# Patient Record
Sex: Female | Born: 1935 | Race: Black or African American | Hispanic: No
Health system: Southern US, Community
[De-identification: ages and names within clinical notes are randomized; demographics above are authoritative.]

## PROBLEM LIST (undated history)

## (undated) DIAGNOSIS — I2510 Atherosclerotic heart disease of native coronary artery without angina pectoris: Secondary | ICD-10-CM

## (undated) DIAGNOSIS — I10 Essential (primary) hypertension: Secondary | ICD-10-CM

## (undated) DIAGNOSIS — D649 Anemia, unspecified: Secondary | ICD-10-CM

## (undated) DIAGNOSIS — N289 Disorder of kidney and ureter, unspecified: Secondary | ICD-10-CM

## (undated) DIAGNOSIS — F0390 Unspecified dementia without behavioral disturbance: Secondary | ICD-10-CM

---

## 2005-01-25 ENCOUNTER — Ambulatory Visit: Payer: Self-pay | Admitting: Family Medicine

## 2005-11-07 ENCOUNTER — Ambulatory Visit: Payer: Self-pay | Admitting: Nurse Practitioner

## 2006-07-16 ENCOUNTER — Emergency Department: Payer: Self-pay | Admitting: Unknown Physician Specialty

## 2006-07-16 ENCOUNTER — Other Ambulatory Visit: Payer: Self-pay

## 2006-07-20 ENCOUNTER — Ambulatory Visit: Payer: Self-pay | Admitting: Unknown Physician Specialty

## 2006-09-10 ENCOUNTER — Ambulatory Visit: Payer: Self-pay | Admitting: Internal Medicine

## 2007-04-29 ENCOUNTER — Ambulatory Visit: Payer: Self-pay | Admitting: Vascular Surgery

## 2007-10-08 ENCOUNTER — Ambulatory Visit: Payer: Self-pay | Admitting: Family Medicine

## 2007-11-13 ENCOUNTER — Ambulatory Visit: Payer: Self-pay | Admitting: Internal Medicine

## 2008-06-08 ENCOUNTER — Ambulatory Visit: Payer: Self-pay | Admitting: Family Medicine

## 2008-07-21 ENCOUNTER — Ambulatory Visit: Payer: Self-pay | Admitting: Family Medicine

## 2009-01-16 ENCOUNTER — Emergency Department: Payer: Self-pay | Admitting: Internal Medicine

## 2010-02-01 ENCOUNTER — Emergency Department: Payer: Self-pay | Admitting: Emergency Medicine

## 2010-02-02 ENCOUNTER — Emergency Department: Payer: Self-pay | Admitting: Emergency Medicine

## 2012-09-25 LAB — CK TOTAL AND CKMB (NOT AT ARMC)
CK, Total: 148 U/L (ref 21–215)
CK-MB: 1.1 ng/mL (ref 0.5–3.6)

## 2012-09-25 LAB — CBC WITH DIFFERENTIAL/PLATELET
Basophil #: 0 10*3/uL (ref 0.0–0.1)
Eosinophil #: 0.1 10*3/uL (ref 0.0–0.7)
Eosinophil %: 1.6 %
HCT: 36.1 % (ref 35.0–47.0)
Lymphocyte %: 13.7 %
MCH: 35.2 pg — ABNORMAL HIGH (ref 26.0–34.0)
MCHC: 34.5 g/dL (ref 32.0–36.0)
Monocyte #: 0.7 x10 3/mm (ref 0.2–0.9)
Monocyte %: 13.2 %
Platelet: 207 10*3/uL (ref 150–440)
WBC: 5 10*3/uL (ref 3.6–11.0)

## 2012-09-25 LAB — COMPREHENSIVE METABOLIC PANEL
Albumin: 3.8 g/dL (ref 3.4–5.0)
Alkaline Phosphatase: 41 U/L — ABNORMAL LOW (ref 50–136)
BUN: 89 mg/dL — ABNORMAL HIGH (ref 7–18)
Bilirubin,Total: 0.5 mg/dL (ref 0.2–1.0)
Calcium, Total: 9.5 mg/dL (ref 8.5–10.1)
Chloride: 98 mmol/L (ref 98–107)
Creatinine: 3.4 mg/dL — ABNORMAL HIGH (ref 0.60–1.30)
EGFR (African American): 13 — ABNORMAL LOW
Osmolality: 304 (ref 275–301)
Potassium: 3.6 mmol/L (ref 3.5–5.1)
SGOT(AST): 21 U/L (ref 15–37)
SGPT (ALT): 21 U/L (ref 12–78)
Sodium: 136 mmol/L (ref 136–145)

## 2012-09-25 LAB — TROPONIN I: Troponin-I: <0.02 ng/mL

## 2012-09-26 ENCOUNTER — Inpatient Hospital Stay: Payer: Self-pay | Admitting: Internal Medicine

## 2012-09-26 LAB — BASIC METABOLIC PANEL
Anion Gap: 6 — ABNORMAL LOW (ref 7–16)
BUN: 86 mg/dL — ABNORMAL HIGH (ref 7–18)
Calcium, Total: 9.2 mg/dL (ref 8.5–10.1)
Chloride: 102 mmol/L (ref 98–107)
Co2: 30 mmol/L (ref 21–32)
EGFR (African American): 17 — ABNORMAL LOW
Potassium: 3.6 mmol/L (ref 3.5–5.1)

## 2012-09-26 LAB — URINALYSIS, COMPLETE
Bacteria: NONE SEEN
Glucose,UR: NEGATIVE mg/dL (ref 0–75)
Nitrite: NEGATIVE
Ph: 7 (ref 4.5–8.0)
RBC,UR: <1 /HPF (ref 0–5)
Squamous Epithelial: 2

## 2012-09-27 LAB — CBC WITH DIFFERENTIAL/PLATELET
Basophil #: 0 10*3/uL (ref 0.0–0.1)
Basophil %: 0.7 %
Eosinophil #: 0.2 10*3/uL (ref 0.0–0.7)
HCT: 36.9 % (ref 35.0–47.0)
HGB: 12.7 g/dL (ref 12.0–16.0)
Lymphocyte %: 32 %
MCHC: 34.5 g/dL (ref 32.0–36.0)
Monocyte #: 0.7 x10 3/mm (ref 0.2–0.9)
Monocyte %: 12.7 %
Neutrophil #: 2.8 10*3/uL (ref 1.4–6.5)
Neutrophil %: 50.2 %
RBC: 3.61 10*6/uL — ABNORMAL LOW (ref 3.80–5.20)

## 2012-09-27 LAB — BASIC METABOLIC PANEL
BUN: 41 mg/dL — ABNORMAL HIGH (ref 7–18)
Calcium, Total: 9.3 mg/dL (ref 8.5–10.1)
Chloride: 109 mmol/L — ABNORMAL HIGH (ref 98–107)
Co2: 29 mmol/L (ref 21–32)
EGFR (African American): 33 — ABNORMAL LOW
EGFR (Non-African Amer.): 28 — ABNORMAL LOW
Glucose: 98 mg/dL (ref 65–99)
Potassium: 3.9 mmol/L (ref 3.5–5.1)

## 2012-09-27 LAB — TSH: Thyroid Stimulating Horm: 3.59 u[IU]/mL

## 2012-09-28 LAB — BASIC METABOLIC PANEL
BUN: 27 mg/dL — ABNORMAL HIGH (ref 7–18)
Calcium, Total: 8.9 mg/dL (ref 8.5–10.1)
EGFR (African American): 33 — ABNORMAL LOW
EGFR (Non-African Amer.): 28 — ABNORMAL LOW
Glucose: 137 mg/dL — ABNORMAL HIGH (ref 65–99)

## 2012-09-29 LAB — BASIC METABOLIC PANEL
Anion Gap: 5 — ABNORMAL LOW (ref 7–16)
Calcium, Total: 9.1 mg/dL (ref 8.5–10.1)
Chloride: 106 mmol/L (ref 98–107)
Co2: 29 mmol/L (ref 21–32)
Creatinine: 1.35 mg/dL — ABNORMAL HIGH (ref 0.60–1.30)
EGFR (African American): 40 — ABNORMAL LOW
EGFR (Non-African Amer.): 35 — ABNORMAL LOW
Osmolality: 283 (ref 275–301)
Sodium: 140 mmol/L (ref 136–145)

## 2013-02-14 ENCOUNTER — Emergency Department: Payer: Self-pay | Admitting: Emergency Medicine

## 2013-02-14 LAB — COMPREHENSIVE METABOLIC PANEL
Albumin: 3.3 g/dL — ABNORMAL LOW (ref 3.4–5.0)
Alkaline Phosphatase: 55 U/L (ref 50–136)
Anion Gap: 5 — ABNORMAL LOW (ref 7–16)
Bilirubin,Total: 0.2 mg/dL (ref 0.2–1.0)
Calcium, Total: 9.2 mg/dL (ref 8.5–10.1)
Chloride: 106 mmol/L (ref 98–107)
Co2: 27 mmol/L (ref 21–32)
Creatinine: 1.11 mg/dL (ref 0.60–1.30)
EGFR (African American): 51 — ABNORMAL LOW
Potassium: 3.8 mmol/L (ref 3.5–5.1)
SGOT(AST): 27 U/L (ref 15–37)
Sodium: 138 mmol/L (ref 136–145)
Total Protein: 7.4 g/dL (ref 6.4–8.2)

## 2013-02-14 LAB — CBC
HCT: 35.7 % (ref 35.0–47.0)
HGB: 12.3 g/dL (ref 12.0–16.0)
Platelet: 229 10*3/uL (ref 150–440)
RBC: 3.5 10*6/uL — ABNORMAL LOW (ref 3.80–5.20)
RDW: 13.3 % (ref 11.5–14.5)

## 2013-02-14 LAB — URINALYSIS, COMPLETE
Bilirubin,UR: NEGATIVE
Blood: NEGATIVE
Ketone: NEGATIVE
Leukocyte Esterase: NEGATIVE
Nitrite: NEGATIVE
Specific Gravity: 1.017 (ref 1.003–1.030)
Squamous Epithelial: 2
WBC UR: <1 /HPF (ref 0–5)

## 2013-07-09 ENCOUNTER — Emergency Department: Payer: Self-pay | Admitting: Emergency Medicine

## 2013-07-09 LAB — CBC
HCT: 38.4 % (ref 35.0–47.0)
HGB: 12.6 g/dL (ref 12.0–16.0)
MCH: 33.7 pg (ref 26.0–34.0)
MCHC: 32.9 g/dL (ref 32.0–36.0)
MCV: 103 fL — AB (ref 80–100)
Platelet: 273 10*3/uL (ref 150–440)
RBC: 3.75 10*6/uL — AB (ref 3.80–5.20)
RDW: 13.9 % (ref 11.5–14.5)
WBC: 3.8 10*3/uL (ref 3.6–11.0)

## 2013-07-09 LAB — URINALYSIS, COMPLETE
Bacteria: NONE SEEN
Bilirubin,UR: NEGATIVE
Blood: NEGATIVE
Glucose,UR: NEGATIVE mg/dL (ref 0–75)
Ketone: NEGATIVE
Leukocyte Esterase: NEGATIVE
NITRITE: NEGATIVE
PROTEIN: NEGATIVE
Ph: 5 (ref 4.5–8.0)
SPECIFIC GRAVITY: 1.019 (ref 1.003–1.030)
Squamous Epithelial: 1
WBC UR: 1 /HPF (ref 0–5)

## 2013-07-09 LAB — COMPREHENSIVE METABOLIC PANEL
ALK PHOS: 47 U/L
Albumin: 3.6 g/dL (ref 3.4–5.0)
Anion Gap: 3 — ABNORMAL LOW (ref 7–16)
BILIRUBIN TOTAL: 0.4 mg/dL (ref 0.2–1.0)
BUN: 17 mg/dL (ref 7–18)
CALCIUM: 9.3 mg/dL (ref 8.5–10.1)
CREATININE: 1.35 mg/dL — AB (ref 0.60–1.30)
Chloride: 102 mmol/L (ref 98–107)
Co2: 31 mmol/L (ref 21–32)
EGFR (Non-African Amer.): 34 — ABNORMAL LOW
GFR CALC AF AMER: 40 — AB
Glucose: 120 mg/dL — ABNORMAL HIGH (ref 65–99)
Osmolality: 275 (ref 275–301)
Potassium: 4.1 mmol/L (ref 3.5–5.1)
SGOT(AST): 20 U/L (ref 15–37)
SGPT (ALT): 21 U/L (ref 12–78)
SODIUM: 136 mmol/L (ref 136–145)
Total Protein: 8.1 g/dL (ref 6.4–8.2)

## 2013-07-09 LAB — TROPONIN I: Troponin-I: <0.02 ng/mL

## 2014-07-23 NOTE — Discharge Summary (Signed)
PATIENT NAME:  Rachel Landry, Shley V MR#:  [id number] DATE OF BIRTH:  1920-08-28  DATE OF ADMISSION:  09/26/2012 DATE OF DISCHARGE:  09/29/3012  DISCHARGE DIAGNOSES: 1.  Acute renal failure on chronic kidney disease stage III, likely due to dehydration versus acute tubular necrosis, could be due to hypotension worsened by angiotensin II receptor blocker and hydrochlorothiazide, now stopped.  Baseline GFR of 45. The patient was encouraged oral fluid intake and was recommended flavored sparkling water instead of excessive sodas. This was discussed with the patient's family also, considering the patient's mental status.  2.  Hypotension with blood pressure of 79/42 in the Emergency Department, now resolved.  This could have contributed to acute renal failure and acute tubular necrosis.  3.  Generalized weakness, recommended skilled nursing facility where the patient is going. She was initially reluctant to go, but family has convinced her as she is not able to make a decision, and it is not safe for her to go back home like this considering likely she has dementia.  4.  Dehydration, now resolved with hydration and encouraged oral intake.  5.  Dementia started on Aricept and Namenda.   SECONDARY DIAGNOSES: 1.  Hypertension.  2.  Hyperlipidemia.  3.  Peripheral vascular disease.  4.  Dementia.  5.  Anemia. 6.  Chronic kidney disease stage III.   CONSULTANTS:  1.  Nephrology, Dr. Thedore MinsSingh.  2.  Physical Therapy.   LABORATORY AND RADIOLOGICAL DATA:  Chest x-ray on the 26th of June showed no acute cardiopulmonary disease.  Right lower extremity Doppler on the 26th of June showed no evidence of DVT. Popliteal fossa cyst.  Bilateral kidney ultrasound on the 27th of June showed normal-appearing sonogram bilaterally.   Major laboratory panel: UA on admission was negative.   HISTORY AND SHORT HOSPITAL COURSE: The patient is an 81 year old female with the above-mentioned medical problems who was admitted  for acute on chronic kidney disease, stage III. Nephrology consultation was obtained with Drs. Thedore MinsSingh and Lateef. Her renal failure was thought to be due to acute tubular necrosis versus prerenal etiology from her hypotension as her blood pressure was 79/52 in the Emergency Department. She was also dehydrated, was hydrated aggressively with IV fluids and her creatinine improved. She was also found to be very weak and was evaluated by Physical Therapy, who recommended a skilled nursing facility where she is being discharged.   PERTINENT DISCHARGE PHYSICAL EXAMINATION:   VITAL SIGNS: On the date of discharge, temperature is 98.9, heart rate 66, respirations 20 per minute, blood pressure 117/56 mmHg. She is saturating 98% on room air.   CARDIOVASCULAR: S1, S2 normal. No murmur, rubs or gallops.  LUNGS:  Clear to auscultation bilaterally.  No rales, rhonchi or crepitations.  ABDOMEN: Soft, benign.  NEUROLOGICAL:  Nonfocal examination.   All other physical examination remained at baseline.   DISCHARGE MEDICATIONS:  1.  Crestor 5 mg p.o. at bedtime.  2.  Aspirin 81 mg p.o. daily.  3.  Tylenol 500 mg, 2 tablets p.o. every 6 hours.  4.  Iron sulfate 325 mg p.o. b.i.d.  5.  Colace 100 mg, 2 capsules p.o. at bedtime.  6.  Aricept 5 mg p.o. at bedtime. 7.  Trazodone 50 mg 1/2 tablet p.o. at bedtime. 8.  Ropinirole 0.5 mg p.o. at bedtime.  9.  Amlodipine 2.5 mg p.o. daily. 10.  Memantine 5 mg p.o. b.i.d.  11.  Ensure 240 mL p.o. 3 times a day with meals.    DISCHARGE  DIET: Renal.   DISCHARGE ACTIVITY: As tolerated.     DISCHARGE INSTRUCTIONS AND FOLLOWUP:   1.  The patient was instructed to follow up with her primary care physician, Dr. Darreld Mclean, in 1 to 2 weeks.  2.  She will need followup with Dr. Thedore Mins from nephrology on 07/10 at 12:00 p.m. as scheduled.  3.  She will get Physical Therapy evaluation and management while at the facility.   TOTAL TIME DISCHARGING THIS PATIENT:  55 minutes.     ____________________________ Ellamae Sia. Sherryll Burger, MD vss:cb D: 09/29/2012 14:29:32 ET T: 09/29/2012 14:50:11 ET JOB#: [id number]  cc: Nicolai Labonte S. Sherryll Burger, MD, <Dictator> Leanna Sato, MD Mosetta Pigeon, MD Ellamae Sia [facility] MD ELECTRONICALLY SIGNED 09/30/2012 21:24

## 2014-07-23 NOTE — H&P (Signed)
PATIENT NAME:  Rachel Landry, Rachel Landry MR#:  [id number] DATE OF BIRTH:  Dec 09, 1920  DATE OF ADMISSION:  09/26/2012  PRIMARY CARE PHYSICIAN:  Dr. Darreld Mclean.  REFERRING PHYSICIAN:  Dr. Manson Passey.   CHIEF COMPLAINT:  Decreased by mouth intake, low blood pressure.   HISTORY OF PRESENT ILLNESS:  The patient is an 81 year old African American female with a past medical history of peripheral vascular disease, chronic renal insufficiency, hypertension, hyperlipidemia, dementia, who lives alone, was brought into the ER for decreased by mouth intake and low blood pressure.  According to the patient's son who is at bedside she lives alone and for the past two days she has been not eating or drinking well.  Her baseline mental status is getting worse and today when he checked her blood pressure it was low and systolic blood pressure was at around 80s.  The patient's son used to work as a Radiation protection practitioner and he is concerned about mom's status and he brought her into the ER.  In the ER, the patient was complaining of severe right lower extremity pain, regarding which a right lower extremity venous Doppler was done to rule out DVT.  They have diagnosed popliteal cyst, but no sonographic evidence of DVT.  The patient was given fluid boluses and hospitalist team is called to admit the patient.  In the ER patient's initial blood pressure was at 91/39.  She has received 2 liters of fluid boluses.   PAST MEDICAL HISTORY:  Hypertension, hyperlipidemia, peripheral vascular disease, dementia, anemia, chronic renal insufficiency.   PAST SURGICAL HISTORY:  Hysterectomy and C-section, status post stent placement for peripheral vascular disease.   ALLERGIES:  She is allergic to PENICILLIN.   PSYCHOSOCIAL HISTORY:  Lives alone.  No smoking, alcohol or illicit drug usage.  The patient's son lives close by.   HOME MEDICATIONS:  Trazodone 50 mg half tablet at bedtime, Crestor 5 mg once daily, aspirin 81 mg enteric-coated once daily,  hydrochlorothiazide 25 mg once daily, acetaminophen 500 mg 2 tablets by mouth q. 6 hours as needed, losartan 50 mg once daily, hydralazine 25 mg q. 8 hours, isosorbide 30 mg once daily, metoprolol 25 mg twice a day,  iron sulfate 325 mg twice a day, amlodipine 10 mg once daily.   FAMILY HISTORY:  Unknown as the patient is demented.   REVIEW OF SYSTEMS:  Unobtainable as the patient is demented at baseline and also more confused tonight.   PHYSICAL EXAMINATION: VITAL SIGNS:  Temperature 98.5, pulse 50 to 63, respirations 20, blood pressure initially 91/39, after 2 liters of fluid bolus it was at 102/42 and subsequently it went up to 138/76, pulse ox 98%.  GENERAL APPEARANCE:  Not under acute distress.  Moderately built and moderately nourished.  HEENT:  Normocephalic, atraumatic.  Pupils are equally reacting to light and accommodation.  Extraocular movements are intact.  No scleral icterus.  No sinus tenderness.  No postnasal drip.  No pharyngeal exudates.  NECK:  Supple.  No JVD.  No thyromegaly.  No lymphadenopathy.  Range of motion is intact.  LUNGS:  Clear to auscultation bilaterally.  No accessory muscle usage.  No anterior chest wall tenderness on palpation.  CARDIAC:  S1, S2 normal.  Regular rate and rhythm.  No murmurs.  GASTROINTESTINAL:  Soft.  Bowel sounds are positive in all four quadrants.  Nontender, nondistended.  No masses felt.  No hepatosplenomegaly.  NEUROLOGIC:  Awake and alert, oriented to place and person, but not to time.   EXTREMITIES:  Right lower extremity bruises are noticed on the right lower extremity and popliteal area is extremely tender.  No edema. No cyanosis. No clubbing.  SKIN:  Warm to touch, dry, but no rashes noticed.  MUSCULOSKELETAL:  Except for the right knee joint in the popliteal area with tenderness from popliteal cyst.  No other joint effusions were noticed.   LABORATORY AND IMAGING STUDIES:  Glucose is 188, BUN 89, creatinine 3.40, sodium 136, potassium  3.6, chloride 98, CO2 29.  GFR is 13.  Serum osmolality 304, calcium 9.5.  LFTs are within normal range except alkaline phosphatase which is low at 41.  CK total 148, troponin less than 0.02.  CPK-MB 1.1.  WBC 5.0, hemoglobin 12.5, hematocrit 36.1, platelets 207, MCV 102.  Ultrasound of the right lower extremity has revealed no DVT, popliteal cyst is present.  A 12-lead EKG, normal sinus rhythm at 60 beats per minute.  Normal PR interval.  No ST-T wave changes were noticed.   ASSESSMENT AND PLAN: 1.  Acute on chronic renal insufficiency from dehydration/decreased by mouth intake.  The patient has had 2 liters of fluid bolus in the ER.  We will continue one more liter of fluid bolus.  Given the history of congestive heart failure, we will monitor for symptoms and signs of fluid overload.  Hold off on the ACE inhibitors and avoid all nephrotoxins.  Nephrology consult is placed.  2.  Hypotension.  We will hold blood pressure medications and provide her IV fluids with close monitoring for symptoms and signs of fluid overload.  3.  Peripheral vascular disease status post stent.  We will continue her home medication.  4.  Chronic history of dementia.  Resume home meds.  5.  Chronic history of anemia, probably from chronic renal insufficiency.  We will continue iron supplement.   6.  Consult case management for possible placement to assisted living facility.   7.  We will provide her GI and DVT prophylaxis.  8.  CODE STATUS:  She is DO NOT RESUSCITATE.  Son is power of attorney.   The diagnosis and plan of care was discussed in detail with the patient's son, Mr. Mariane BaumgartenMichael Sheeran.  He is aware of the plan.   Total time spent on admission is 50 minutes.     ____________________________ Ramonita LabAruna Amee Boothe, MD ag:ea D: 09/26/2012 01:38:22 ET T: 09/26/2012 02:52:18 ET JOB#: [id number]  cc: Ramonita LabAruna Ameliya Nicotra, MD, <Dictator> Leanna SatoLinda M. Miles, MD Ramonita LabARUNA Vuk Skillern MD ELECTRONICALLY SIGNED 09/28/2012 0:20

## 2017-05-06 ENCOUNTER — Inpatient Hospital Stay
Admission: EM | Admit: 2017-05-06 | Discharge: 2017-05-31 | DRG: 189 | Disposition: E | Payer: Medicare Other | Attending: Internal Medicine | Admitting: Internal Medicine

## 2017-05-06 ENCOUNTER — Other Ambulatory Visit: Payer: Self-pay

## 2017-05-06 ENCOUNTER — Emergency Department: Payer: Medicare Other

## 2017-05-06 DIAGNOSIS — M25551 Pain in right hip: Secondary | ICD-10-CM | POA: Diagnosis present

## 2017-05-06 DIAGNOSIS — Z880 Allergy status to penicillin: Secondary | ICD-10-CM | POA: Diagnosis not present

## 2017-05-06 DIAGNOSIS — R627 Adult failure to thrive: Secondary | ICD-10-CM | POA: Diagnosis present

## 2017-05-06 DIAGNOSIS — Z66 Do not resuscitate: Secondary | ICD-10-CM | POA: Diagnosis present

## 2017-05-06 DIAGNOSIS — R52 Pain, unspecified: Secondary | ICD-10-CM

## 2017-05-06 DIAGNOSIS — J9621 Acute and chronic respiratory failure with hypoxia: Secondary | ICD-10-CM | POA: Diagnosis not present

## 2017-05-06 DIAGNOSIS — I129 Hypertensive chronic kidney disease with stage 1 through stage 4 chronic kidney disease, or unspecified chronic kidney disease: Secondary | ICD-10-CM | POA: Diagnosis present

## 2017-05-06 DIAGNOSIS — E1122 Type 2 diabetes mellitus with diabetic chronic kidney disease: Secondary | ICD-10-CM | POA: Diagnosis present

## 2017-05-06 DIAGNOSIS — I2510 Atherosclerotic heart disease of native coronary artery without angina pectoris: Secondary | ICD-10-CM | POA: Diagnosis present

## 2017-05-06 DIAGNOSIS — Z8249 Family history of ischemic heart disease and other diseases of the circulatory system: Secondary | ICD-10-CM

## 2017-05-06 DIAGNOSIS — N189 Chronic kidney disease, unspecified: Secondary | ICD-10-CM | POA: Diagnosis present

## 2017-05-06 DIAGNOSIS — E872 Acidosis: Secondary | ICD-10-CM | POA: Diagnosis present

## 2017-05-06 DIAGNOSIS — Z7984 Long term (current) use of oral hypoglycemic drugs: Secondary | ICD-10-CM | POA: Diagnosis not present

## 2017-05-06 DIAGNOSIS — N179 Acute kidney failure, unspecified: Secondary | ICD-10-CM | POA: Diagnosis present

## 2017-05-06 DIAGNOSIS — F4489 Other dissociative and conversion disorders: Secondary | ICD-10-CM | POA: Diagnosis present

## 2017-05-06 DIAGNOSIS — F0280 Dementia in other diseases classified elsewhere without behavioral disturbance: Secondary | ICD-10-CM | POA: Diagnosis present

## 2017-05-06 DIAGNOSIS — E8729 Other acidosis: Secondary | ICD-10-CM | POA: Diagnosis present

## 2017-05-06 DIAGNOSIS — G9340 Encephalopathy, unspecified: Secondary | ICD-10-CM | POA: Diagnosis present

## 2017-05-06 DIAGNOSIS — J9602 Acute respiratory failure with hypercapnia: Secondary | ICD-10-CM | POA: Diagnosis present

## 2017-05-06 DIAGNOSIS — Z79899 Other long term (current) drug therapy: Secondary | ICD-10-CM | POA: Diagnosis not present

## 2017-05-06 DIAGNOSIS — Z515 Encounter for palliative care: Secondary | ICD-10-CM | POA: Diagnosis present

## 2017-05-06 DIAGNOSIS — R5381 Other malaise: Secondary | ICD-10-CM | POA: Diagnosis present

## 2017-05-06 DIAGNOSIS — J9622 Acute and chronic respiratory failure with hypercapnia: Secondary | ICD-10-CM | POA: Diagnosis not present

## 2017-05-06 HISTORY — DX: Essential (primary) hypertension: I10

## 2017-05-06 HISTORY — DX: Anemia, unspecified: D64.9

## 2017-05-06 HISTORY — DX: Unspecified dementia, unspecified severity, without behavioral disturbance, psychotic disturbance, mood disturbance, and anxiety: F03.90

## 2017-05-06 HISTORY — DX: Atherosclerotic heart disease of native coronary artery without angina pectoris: I25.10

## 2017-05-06 HISTORY — DX: Disorder of kidney and ureter, unspecified: N28.9

## 2017-05-06 LAB — BLOOD GAS, ARTERIAL
Acid-Base Excess: 7 mmol/L — ABNORMAL HIGH (ref 0.0–2.0)
Bicarbonate: 34.5 mmol/L — ABNORMAL HIGH (ref 20.0–28.0)
DELIVERY SYSTEMS: POSITIVE
EXPIRATORY PAP: 5
FIO2: 0.28
INSPIRATORY PAP: 10
O2 Saturation: 95.4 %
PO2 ART: 81 mmHg — AB (ref 83.0–108.0)
Patient temperature: 37
pCO2 arterial: 61 mmHg — ABNORMAL HIGH (ref 32.0–48.0)
pH, Arterial: 7.36 (ref 7.350–7.450)

## 2017-05-06 LAB — URINALYSIS, COMPLETE (UACMP) WITH MICROSCOPIC
BILIRUBIN URINE: NEGATIVE
Glucose, UA: NEGATIVE mg/dL
HGB URINE DIPSTICK: NEGATIVE
KETONES UR: NEGATIVE mg/dL
Leukocytes, UA: NEGATIVE
Nitrite: NEGATIVE
PROTEIN: NEGATIVE mg/dL
Specific Gravity, Urine: 1.017 (ref 1.005–1.030)
pH: 5 (ref 5.0–8.0)

## 2017-05-06 LAB — CBC WITH DIFFERENTIAL/PLATELET
BASOS ABS: 0 10*3/uL (ref 0–0.1)
BASOS PCT: 0 %
Eosinophils Absolute: 0 10*3/uL (ref 0–0.7)
Eosinophils Relative: 0 %
HCT: 40.1 % (ref 35.0–47.0)
Hemoglobin: 12.6 g/dL (ref 12.0–16.0)
Lymphocytes Relative: 7 %
Lymphs Abs: 0.9 10*3/uL — ABNORMAL LOW (ref 1.0–3.6)
MCH: 33.3 pg (ref 26.0–34.0)
MCHC: 31.5 g/dL — ABNORMAL LOW (ref 32.0–36.0)
MCV: 105.5 fL — ABNORMAL HIGH (ref 80.0–100.0)
MONO ABS: 1.2 10*3/uL — AB (ref 0.2–0.9)
Monocytes Relative: 10 %
Neutro Abs: 10.4 10*3/uL — ABNORMAL HIGH (ref 1.4–6.5)
Neutrophils Relative %: 83 %
PLATELETS: 214 10*3/uL (ref 150–440)
RBC: 3.8 MIL/uL (ref 3.80–5.20)
RDW: 14.5 % (ref 11.5–14.5)
WBC: 12.5 10*3/uL — AB (ref 3.6–11.0)

## 2017-05-06 LAB — COMPREHENSIVE METABOLIC PANEL
ALBUMIN: 2.8 g/dL — AB (ref 3.5–5.0)
ALT: 24 U/L (ref 14–54)
AST: 35 U/L (ref 15–41)
Alkaline Phosphatase: 58 U/L (ref 38–126)
Anion gap: 9 (ref 5–15)
BUN: 54 mg/dL — AB (ref 6–20)
CHLORIDE: 105 mmol/L (ref 101–111)
CO2: 32 mmol/L (ref 22–32)
CREATININE: 2.03 mg/dL — AB (ref 0.44–1.00)
Calcium: 8.9 mg/dL (ref 8.9–10.3)
GFR calc Af Amer: 23 mL/min — ABNORMAL LOW (ref >60)
GFR calc non Af Amer: 20 mL/min — ABNORMAL LOW (ref >60)
GLUCOSE: 146 mg/dL — AB (ref 65–99)
POTASSIUM: 5.1 mmol/L (ref 3.5–5.1)
Sodium: 146 mmol/L — ABNORMAL HIGH (ref 135–145)
Total Bilirubin: 0.9 mg/dL (ref 0.3–1.2)
Total Protein: 7.2 g/dL (ref 6.5–8.1)

## 2017-05-06 LAB — LIPASE, BLOOD: Lipase: 35 U/L (ref 11–51)

## 2017-05-06 LAB — BLOOD GAS, VENOUS
ACID-BASE EXCESS: 6.6 mmol/L — AB (ref 0.0–2.0)
Bicarbonate: 36.8 mmol/L — ABNORMAL HIGH (ref 20.0–28.0)
O2 SAT: 64.4 %
PCO2 VEN: 82 mmHg — AB (ref 44.0–60.0)
PO2 VEN: 39 mmHg (ref 32.0–45.0)
Patient temperature: 37
pH, Ven: 7.26 (ref 7.250–7.430)

## 2017-05-06 LAB — LACTIC ACID, PLASMA: Lactic Acid, Venous: 1.3 mmol/L (ref 0.5–1.9)

## 2017-05-06 LAB — INFLUENZA PANEL BY PCR (TYPE A & B)
Influenza A By PCR: NEGATIVE
Influenza B By PCR: NEGATIVE

## 2017-05-06 LAB — CK: CK TOTAL: 171 U/L (ref 38–234)

## 2017-05-06 MED ORDER — METHYLPREDNISOLONE SODIUM SUCC 125 MG IJ SOLR
125.0000 mg | INTRAMUSCULAR | Status: AC
  Administered 2017-05-06: 125 mg via INTRAVENOUS
  Filled 2017-05-06: qty 2

## 2017-05-06 MED ORDER — CLONIDINE HCL 0.2 MG/24HR TD PTWK
0.2000 mg | MEDICATED_PATCH | TRANSDERMAL | Status: DC
  Filled 2017-05-06 (×2): qty 1

## 2017-05-06 MED ORDER — SODIUM CHLORIDE 0.9 % IV SOLN
INTRAVENOUS | Status: DC
  Administered 2017-05-07: 03:00:00 via INTRAVENOUS

## 2017-05-06 MED ORDER — SODIUM CHLORIDE 0.9 % IV BOLUS (SEPSIS)
500.0000 mL | Freq: Once | INTRAVENOUS | Status: AC
  Administered 2017-05-06: 500 mL via INTRAVENOUS

## 2017-05-06 MED ORDER — FENTANYL CITRATE (PF) 100 MCG/2ML IJ SOLN
12.5000 ug | Freq: Once | INTRAMUSCULAR | Status: AC
Start: 2017-05-06 — End: 2017-05-06
  Administered 2017-05-06: 12.5 ug via INTRAVENOUS
  Filled 2017-05-06: qty 2

## 2017-05-06 MED ORDER — IPRATROPIUM-ALBUTEROL 0.5-2.5 (3) MG/3ML IN SOLN
3.0000 mL | Freq: Once | RESPIRATORY_TRACT | Status: AC
  Administered 2017-05-06: 3 mL via RESPIRATORY_TRACT
  Filled 2017-05-06: qty 3

## 2017-05-06 NOTE — ED Provider Notes (Signed)
[facility] Emergency Department Provider Note   ____________________________________________   First MD Initiated Contact with Patient 05/25/2017 1708     (approximate)  I have reviewed the triage vital signs and the nursing notes.   HISTORY  Chief Complaint Hip Pain  EM caveat: Patient's dementia limits evaluation and history taking  HPI Rachel Landry is a 84 y.o. female lives in a nursing home, has history of chronic kidney disease urinary tract infections  History provided is via the patient's family, her son Rachel NeedleMichael via phone who family presents as healthcare power of attorney, also the patient's family at the bedside provide history  The patient has been very fatigued weak and less responsive since about Friday.  They report that she is continually noted pain, the nursing home and ordered x-rays for her but due to pain they were unable to obtain x-rays of the hip.  No fall or injury was reported, however.  They have noted whenever the patient moves she seems to be in significant pain.  No nausea or vomiting.  She has not been eating hardly anything, her lips are very dry.  She seems more weak than normal.  Discussed with the patient's son Rachel NeedleMichael, he reports that he is a former paramedic and wishes and understands that she needs to be balanced between pain control and worsening of her respiratory status and mental status.  The patient is DO NOT RESUSCITATE, they report they have also involved hospice in her care.  They do wish to provide pain, evaluate for cause of her confusion.  Patient is able to report that she "hurts everywhere" she cannot describe further as to where that pain really is.    Past Medical History:  Diagnosis Date  . Anemia   . CAD (coronary artery disease)   . Dementia   . Hypertension   . Renal disorder     Patient Active Problem List   Diagnosis Date Noted  . CO2 retention 05/13/2017    History reviewed. No  pertinent surgical history.  Prior to Admission medications   Medication Sig Start Date End Date Taking? Authorizing Provider  Acetaminophen (ACETAMIN PO) Take 650-1,000 mg by mouth. TAKE 650MG  BY MOUTH EVERY 4 HOURS AS NEEDED FOR FEVERS TAKE 1000MG  BY MOUTH EVERY SIX HOURS AS NEEDED FOR PAIN   Yes [provider]  aluminum-magnesium hydroxide-simethicone (MAALOX) 200-200-20 MG/5ML SUSP Take 30 mLs by mouth 4 (four) times daily as needed.   Yes [provider]  atorvastatin (LIPITOR) 10 MG tablet Take 10 mg by mouth every evening.   Yes [provider]  brimonidine (ALPHAGAN) 0.2 % ophthalmic solution Place 1 drop into both eyes 2 (two) times daily.   Yes [provider]  cloNIDine (CATAPRES - DOSED IN MG/24 HR) 0.2 mg/24hr patch Place 0.2 mg onto the skin once a week.   Yes [provider]  diphenhydrAMINE (BENADRYL) 25 MG tablet Take 25 mg by mouth every 6 (six) hours as needed.   Yes [provider]  ferrous sulfate 325 (65 FE) MG EC tablet Take 325 mg by mouth 2 (two) times daily.   Yes [provider]  glipiZIDE (GLUCOTROL) 5 MG tablet Take 5 mg by mouth 2 (two) times daily with a meal.   Yes [provider]  guaifenesin (ROBITUSSIN) 100 MG/5ML syrup Take 300 mg by mouth every 6 (six) hours as needed for cough.   Yes [provider]  ipratropium-albuterol (DUONEB) 0.5-2.5 (3) MG/3ML SOLN Take  3 mLs by nebulization 3 (three) times daily as needed.   Yes [provider]  loperamide (IMODIUM) 2 MG capsule Take 4 mg by mouth as needed for diarrhea or loose stools. Max 8 doses in 24 hours   Yes [provider]  LORazepam (ATIVAN) 0.5 MG tablet Take 0.5 mg by mouth 2 (two) times daily.   Yes [provider]  magnesium hydroxide (MILK OF MAGNESIA) 400 MG/5ML suspension Take 30 mLs by mouth daily as needed for mild constipation.   Yes [provider]  meloxicam (MOBIC) 7.5 MG tablet  Take 7.5 mg by mouth every morning.   Yes [provider]  Multiple Vitamins-Minerals (I-VITE PROTECT) TABS Take 1 tablet by mouth daily.   Yes [provider]  OLANZapine zydis (ZYPREXA) 10 MG disintegrating tablet Take 10 mg by mouth daily.   Yes [provider]  polyethylene glycol (MIRALAX / GLYCOLAX) packet Take 17 g by mouth daily.   Yes [provider]  rivastigmine (EXELON) 9.5 mg/24hr Place 9.5 mg onto the skin daily.   Yes [provider]  sitaGLIPtin (JANUVIA) 25 MG tablet Take 25 mg by mouth daily.   Yes [provider]  torsemide (DEMADEX) 20 MG tablet Take 20 mg by mouth daily.   Yes [provider]  Travoprost, BAK Free, (TRAVATAN) 0.004 % SOLN ophthalmic solution Place 1 drop into both eyes at bedtime.   Yes [provider]    Allergies Penicillins  No family history on file.  Social History Social History   Tobacco Use  . Smoking status: Not on file  Substance Use Topics  . Alcohol use: Not on file  . Drug use: Not on file    Review of Systems  Family is unaware of any recent illness, they do report that she had a prior urinary tract infection that made her but they have similar in the past   ____________________________________________   PHYSICAL EXAM:  VITAL SIGNS: ED Triage Vitals [05-08-2017 1521]  Enc Vitals Group     BP 108/60     Pulse Rate 90     Resp 18     Temp 98.2 F (36.8 C)     Temp Source Oral     SpO2 98 %     Weight      Height      Head Circumference      Peak Flow      Pain Score      Pain Loc      Pain Edu?      Excl. in GC?     Constitutional: Somnolent.  Alert only to voice.  But she is able to say 1-2 words, reports she is just hurting everywhere and she feels weak. Eyes: Conjunctivae are normal. Head: Atraumatic. Nose: No congestion/rhinnorhea. Mouth/Throat: Mucous membranes are dry. Neck: No stridor.  No nuchal rigidity.  Cardiovascular: Normal  rate, regular rhythm. Grossly normal heart sounds.  Good peripheral circulation. Respiratory: Normal respiratory effort.  No retractions. Lungs CTAB though diminished in the bases bilateral. Gastrointestinal: Soft and nontender. No distention. Musculoskeletal:   Patient reports notable pain with flexion and extension attempts at the hips bilaterally, though the right is somewhat hard to tell if its position but it may be slightly shortened compared to the left.  There is no tenderness to palpation of the feet ankles are knees bilaterally.  No obvious deformities are noted.  Lower extremities are well perfused with palpable dorsalis pedis pulses and slight  pedal edema bilateral.  She is able to wiggle her toes on both legs and wiggle her hands, but she is unable to lift her arms or legs off the bed on either side.  She is very weak throughout but no focal defect is noted.  No facial droop.  Neurologic:  Normal speech and language are soft and somewhat hard to understand, patient family at bedside reports this is normal and she seems to have episodes where she gets extremely weak like this and then will have other days where she seems better. Skin:  Skin is warm, dry and intact. No rash noted.  Psychiatric: Mood and affect are flat. ____________________________________________   LABS (all labs ordered are listed, but only abnormal results are displayed)  Labs Reviewed  CBC WITH DIFFERENTIAL/PLATELET - Abnormal; Notable for the following components:      Result Value   WBC 12.5 (*)    MCV 105.5 (*)    MCHC 31.5 (*)    Neutro Abs 10.4 (*)    Lymphs Abs 0.9 (*)    Monocytes Absolute 1.2 (*)    All other components within normal limits  COMPREHENSIVE METABOLIC PANEL - Abnormal; Notable for the following components:   Sodium 146 (*)    Glucose, Bld 146 (*)    BUN 54 (*)    Creatinine, Ser 2.03 (*)    Albumin 2.8 (*)    GFR calc non Af Amer 20 (*)    GFR calc Af Amer 23 (*)    All other  components within normal limits  BLOOD GAS, VENOUS - Abnormal; Notable for the following components:   pCO2, Ven 82 (*)    Bicarbonate 36.8 (*)    Acid-Base Excess 6.6 (*)    All other components within normal limits  URINALYSIS, COMPLETE (UACMP) WITH MICROSCOPIC - Abnormal; Notable for the following components:   Color, Urine YELLOW (*)    APPearance CLOUDY (*)    Bacteria, UA RARE (*)    Squamous Epithelial / LPF 0-5 (*)    All other components within normal limits  URINE CULTURE  LIPASE, BLOOD  LACTIC ACID, PLASMA  CK  INFLUENZA PANEL BY PCR (TYPE A & B)  LACTIC ACID, PLASMA   ____________________________________________  EKG  Reviewed and interpreted by me at 1845  Ventricular rate 95 QRS 70 QTC 420 Atrial fibrillation, no acute ischemia noted ____________________________________________  RADIOLOGY  X-rays are reviewed, no acute abnormality on x-ray of the hips  Chest x-ray no acute infiltrate, some cardiomegaly and crowding CT the head no acute findings  ____________________________________________   PROCEDURES  Procedure(s) performed: None  Procedures  Critical Care performed: Yes, see critical care note(s)  CRITICAL CARE Performed by: Sharyn Creamer   Total critical care time: 40 minutes  Critical care time was exclusive of separately billable procedures and treating other patients.  Critical care was necessary to treat or prevent imminent or life-threatening deterioration.  Critical care was time spent personally by me on the following activities: development of treatment plan with patient and/or surrogate as well as nursing, discussions with consultants, evaluation of patient's response to treatment, examination of patient, obtaining history from patient or surrogate, ordering and performing treatments and interventions, ordering and review of laboratory studies, ordering and review of radiographic studies, pulse oximetry and re-evaluation of patient's  condition.  ____________________________________________   INITIAL IMPRESSION / ASSESSMENT AND PLAN / ED COURSE  Pertinent labs & imaging results that were available during my care of the patient were reviewed  by me and considered in my medical decision making (see chart for details).  Patient presents for evaluation of increasing weakness, confusion.  She has evidently not been out of bed since Friday, having increasing fatigue and somnolence.  She is been complaining of pain all over, hard to describe exactly where in her exam question if she may be experiencing some pain in her hips though it is difficult to side.  No abdominal pain elicited on exam.  Increased confusion,  Clinical Course as of May 06 2040  [facility] May 07, 2017  1719 Patient is DNR.   [MQ]    Clinical Course User Index [MQ] Sharyn Creamer, MD   ----------------------------------------- 7:54 PM on 05/09/2017 -----------------------------------------  Discussed with the patient's power of attorney, Rachel Landry.  Will place patient on BiPAP but we will not escalate to any advanced respiratory intervention such as intubation.  She is DO NOT RESUSCITATE and DO NOT INTUBATE.  She does have some mild acidosis with hypercapnia, suggestive of respiratory acidosis, etiology somewhat unclear though Rachel Landry does report she has a history of CHF.  Will also attempt to give a DuoNeb and Solu-Medrol, placed on BiPAP with good tolerance.  Awaiting urinalysis which has been sent to the lab at this time.  Discussed with the hospitalist Dr. Juliene Pina, patient will be admitted for ongoing care and further evaluation  UA shows only rare bacteria, no other we will culture.  Further care under the hospitalist service  ____________________________________________   FINAL CLINICAL IMPRESSION(S) / ED DIAGNOSES  Final diagnoses:  Acute respiratory failure with hypercapnia (HCC)  Confusion state  Encephalopathy acute      NEW MEDICATIONS STARTED  DURING THIS VISIT:  New Prescriptions   No medications on file     Note:  This document was prepared using Dragon voice recognition software and may include unintentional dictation errors.     Sharyn Creamer, MD 07-May-2017 2042

## 2017-05-06 NOTE — ED Notes (Signed)
Lab at bedside

## 2017-05-06 NOTE — ED Notes (Signed)
Pt to xray

## 2017-05-06 NOTE — H&P (Signed)
[facility] [place] [facility]   PATIENT NAME: Rachel Landry    MR#:  [id number]  DATE OF BIRTH:  1921-03-06  DATE OF ADMISSION:  05/06/2017  PRIMARY CARE PHYSICIAN: System, Pcp Not In   REQUESTING/REFERRING PHYSICIAN: dr Fanny Bien  CHIEF COMPLAINT:   AMS HISTORY OF PRESENT ILLNESS:  Antrice Pal  is a 84 y.o. female with a known history of dementia, CAD and essential hypertension who presents from group home due to altered mental status. Patient was brought in via EMS from the [facility] [place] due to confusion, lethargy and right hip pain. Family reports that since Friday she has been in her bed. They do not know if she has fallen. She was complaining of right hip pain. X-rays show no hip fracture. ABG was performed due to lethargy and her CO2 is elevated. She is on BiPap currently. It does appear that she may be a chronic retainer. She does not have a history of COPD.  She is currently on BiPAP and I am unable to obtain history of present illness from the patient. However as mentioned family is at bedside.   Baseline she uses a walker but predominantly is in a wheelchair. She is able to feed herself. PAST MEDICAL HISTORY:   Past Medical History:  Diagnosis Date  . Anemia   . CAD (coronary artery disease)   . Dementia   . Hypertension   . Renal disorder     PAST SURGICAL HISTORY:  None  SOCIAL HISTORY:  No tobacco, EtOH or IV drug use  FAMILY HISTORY:  Hypertension  DRUG ALLERGIES:   Allergies  Allergen Reactions  . Penicillins     REVIEW OF SYSTEMS:   Review of Systems  Unable to perform ROS: Acuity of condition    MEDICATIONS AT HOME:   Prior to Admission medications   Medication Sig Start Date End Date Taking? Authorizing Provider  Acetaminophen (ACETAMIN PO) Take 650-1,000 mg by mouth. TAKE 650MG  BY MOUTH EVERY 4 HOURS AS NEEDED FOR FEVERS TAKE 1000MG  BY MOUTH EVERY SIX HOURS AS NEEDED FOR PAIN   Yes [provider]  aluminum-magnesium hydroxide-simethicone (MAALOX) 200-200-20 MG/5ML SUSP Take 30 mLs by mouth 4 (four) times daily as needed.   Yes [provider]  atorvastatin (LIPITOR) 10 MG tablet Take 10 mg by mouth every evening.   Yes [provider]  brimonidine (ALPHAGAN) 0.2 % ophthalmic solution Place 1 drop into both eyes 2 (two) times daily.   Yes [provider]  cloNIDine (CATAPRES - DOSED IN MG/24 HR) 0.2 mg/24hr patch Place 0.2 mg onto the skin once a week.   Yes [provider]  diphenhydrAMINE (BENADRYL) 25 MG tablet Take 25 mg by mouth every 6 (six) hours as needed.   Yes [provider]  ferrous sulfate 325 (65 FE) MG EC tablet Take 325 mg by mouth 2 (two) times daily.   Yes [provider]  glipiZIDE (GLUCOTROL) 5 MG tablet Take 5 mg by mouth 2 (two) times daily with a meal.   Yes [provider]  guaifenesin (ROBITUSSIN) 100 MG/5ML syrup Take 300 mg by mouth every 6 (six) hours as needed for cough.   Yes [provider]  ipratropium-albuterol (DUONEB) 0.5-2.5 (3) MG/3ML SOLN Take 3 mLs by nebulization 3 (three) times daily as needed.   Yes [provider]  loperamide (IMODIUM) 2 MG capsule Take 4 mg by mouth as needed for diarrhea or loose stools. Max 8 doses in  24 hours   Yes [provider]  LORazepam (ATIVAN) 0.5 MG tablet Take 0.5 mg by mouth 2 (two) times daily.   Yes [provider]  magnesium hydroxide (MILK OF MAGNESIA) 400 MG/5ML suspension Take 30 mLs by mouth daily as needed for mild constipation.   Yes [provider]  meloxicam (MOBIC) 7.5 MG tablet Take 7.5 mg by mouth every morning.   Yes [provider]  Multiple Vitamins-Minerals (I-VITE PROTECT) TABS Take 1 tablet by mouth daily.   Yes [provider]  OLANZapine zydis (ZYPREXA) 10 MG disintegrating tablet Take 10 mg by mouth daily.   Yes [provider]  polyethylene glycol (MIRALAX /  GLYCOLAX) packet Take 17 g by mouth daily.   Yes [provider]  rivastigmine (EXELON) 9.5 mg/24hr Place 9.5 mg onto the skin daily.   Yes [provider]  sitaGLIPtin (JANUVIA) 25 MG tablet Take 25 mg by mouth daily.   Yes [provider]  torsemide (DEMADEX) 20 MG tablet Take 20 mg by mouth daily.   Yes [provider]  Travoprost, BAK Free, (TRAVATAN) 0.004 % SOLN ophthalmic solution Place 1 drop into both eyes at bedtime.   Yes [provider]      VITAL SIGNS:  Blood pressure 109/65, pulse 97, temperature 98.2 F (36.8 C), temperature source Oral, resp. rate 18, SpO2 99 %.  PHYSICAL EXAMINATION:   Physical Exam  Constitutional: She is well-developed, well-nourished, and in no distress. No distress.  Patient is moaning and on BiPAP  HENT:  Head: Normocephalic.  Eyes: No scleral icterus.  Neck: Normal range of motion. Neck supple. No JVD present. No tracheal deviation present.  Cardiovascular: Normal rate, regular rhythm and normal heart sounds. Exam reveals no gallop and no friction rub.  No murmur heard. Pulmonary/Chest: Effort normal and breath sounds normal. No respiratory distress. She has no wheezes. She has no rales. She exhibits no tenderness.  Abdominal: Soft. Bowel sounds are normal. She exhibits no distension and no mass. There is no tenderness. There is no rebound and no guarding.  Musculoskeletal: She exhibits edema.  She grimaces when I am moving her lower extremities especially the right hip  Neurological:  Lethargic she does not follow any commands at this time   Skin: Skin is warm. No rash noted. No erythema.  Psychiatric:  Patient is lethargic and moaning she has dementia      LABORATORY PANEL:   CBC Recent Labs  Lab 05/30/2017 1828  WBC 12.5*  HGB 12.6  HCT 40.1  PLT 214   ------------------------------------------------------------------------------------------------------------------  Chemistries   Recent Labs  Lab 05/26/2017 1828  NA 146*  K 5.1  CL 105  CO2 32  GLUCOSE 146*  BUN 54*  CREATININE 2.03*  CALCIUM 8.9  AST 35  ALT 24  ALKPHOS 58  BILITOT 0.9   ------------------------------------------------------------------------------------------------------------------  Cardiac Enzymes No results for input(s): TROPONINI in the last 168 hours. ------------------------------------------------------------------------------------------------------------------  RADIOLOGY:  Ct Head Wo Contrast  Result Date: 05/04/2017 CLINICAL DATA:  Altered level of consciousness. EXAM: CT HEAD WITHOUT CONTRAST TECHNIQUE: Contiguous axial images were obtained from the base of the skull through the vertex without intravenous contrast. COMPARISON:  02/14/2013 FINDINGS: Brain: Age related generalized atrophy. No evidence of old or acute focal infarction, mass lesion, hemorrhage, hydrocephalus or extra-axial collection. Ordinary chronic small-vessel changes of the deep white matter and basal ganglia. Vascular: There is atherosclerotic calcification of the major vessels at the base of the brain. Skull: Normal Sinuses/Orbits:  Clear/normal Other: None IMPRESSION: No acute or reversible finding. Age related atrophy and chronic small-vessel ischemic changes. Electronically Signed   By: Paulina FusiMark  Shogry M.D.   On: 23-Sep-2017 17:59   Dg Chest Port 1 View  Result Date: 05/27/2017 CLINICAL DATA:  Altered mental status EXAM: PORTABLE CHEST 1 VIEW COMPARISON:  02/14/2013 FINDINGS: Probable soft tissue artifact over the chest. Mildly low lung volumes. No focal opacity or effusion. Mild cardiomegaly. Enlarged mediastinal silhouette likely augmented by portable technique and lordotic positioning. Aortic atherosclerosis. No pneumothorax. Advanced degenerative changes of the right shoulder. IMPRESSION: 1. Low lung volumes without acute infiltrate 2. Mild cardiomegaly. Enlarged mediastinal silhouette, likely augmented by low  lung volume, portable technique and lordotic positioning. Electronically Signed   By: Jasmine PangKim  Fujinaga M.D.   On: 23-Sep-2017 19:09   Dg Hip Unilat W Or Wo Pelvis 2-3 Views Left  Result Date: 05/30/2017 CLINICAL DATA:  Hip pain EXAM: DG HIP (WITH OR WITHOUT PELVIS) 2-3V LEFT COMPARISON:  01/16/2009 FINDINGS: Pubic symphysis and rami are intact. No acute displaced fracture or malalignment. Mild degenerative changes of the left hip. IMPRESSION: No acute osseous abnormality. Electronically Signed   By: Jasmine PangKim  Fujinaga M.D.   On: 23-Sep-2017 19:13   Dg Hip Unilat With Pelvis 2-3 Views Right  Result Date: 05/19/2017 CLINICAL DATA:  Hip pain EXAM: DG HIP (WITH OR WITHOUT PELVIS) 2-3V RIGHT COMPARISON:  01/16/2009 FINDINGS: SI joint degenerative changes without asymmetric widening. Pubic symphysis is intact. No acute displaced fracture or malalignment. Mild degenerative changes of the right hip. IMPRESSION: No definite acute osseous abnormality. Electronically Signed   By: Jasmine PangKim  Fujinaga M.D.   On: 23-Sep-2017 19:11    EKG:  EKG reads as atrial fibrillation however patient does have P waves Normal sinus rhythm Nonspecific T-wave changes  IMPRESSION AND PLAN:   84 year old female with history of dementia from group home who presents with right hip pain and confusion  1. Acute encephalopathy due to CO2 narcosis Continue BiPAP Follow mental status Order MRI to evaluate for stroke Check UA to evaluate for urinary tract infection  2. Diabetes hold all oral medications as patient is on BiPAP Adding scale ordered  3. Essential hypertension: When necessary hydralazine and clonidine patch ordered  4. Dementia: Holding medications for now until patient is able to take in oral medications 5. Right hip pain: Consider CT if patient still having right hip pain tomorrow X-rays were negative for acute fracture 6. Acute kidney injury in the setting of poor by mouth intake Start IV fluids and repeat BMP in  a.m.    Case discussed with e link   All the records are reviewed and case discussed with ED provider. Management plans discussed with the patient's family and they are in agreement  CODE STATUS: DO NOT RESUSCITATE  Critical care TOTAL TIME TAKING CARE OF THIS PATIENT: 40 minutes.    Brooklin Rieger M.D on 05/14/2017 at 8:00 PM  Between 7am to 6pm - Pager - [phone]  After 6pm go to [email or web address] - password [email or web address]  [facility] [place] [facility]  Office  [phone]  CC: Primary care physician; System, Pcp Not In

## 2017-05-06 NOTE — ED Notes (Signed)
Lab at bedside to get blood at this time. Other orders delayed for now. MD aware.

## 2017-05-06 NOTE — ED Triage Notes (Signed)
Pt to ER via [facility] from [facility] [address]. Staff called due to patient c/o hip pain. EMS reports that they were told R right pain. Pt holding left hip. No fall or injury. Pt at baseline mentality per EMS. VSS with EMS. Pt grimaces with movement.

## 2017-05-06 NOTE — ED Notes (Signed)
Report to Jennifer, RN

## 2017-05-06 NOTE — ED Notes (Signed)
Tech called to collect urine, states she will be here

## 2017-05-06 NOTE — ED Notes (Signed)
Call to give report, unable to take at this time. Will return call when ready

## 2017-05-06 NOTE — ED Notes (Signed)
Patient transported to CT 

## 2017-05-07 DIAGNOSIS — J9622 Acute and chronic respiratory failure with hypercapnia: Secondary | ICD-10-CM

## 2017-05-07 DIAGNOSIS — J9621 Acute and chronic respiratory failure with hypoxia: Secondary | ICD-10-CM

## 2017-05-07 LAB — GLUCOSE, CAPILLARY: Glucose-Capillary: 145 mg/dL — ABNORMAL HIGH (ref 65–99)

## 2017-05-07 MED ORDER — METHYLPREDNISOLONE SODIUM SUCC 40 MG IJ SOLR
40.0000 mg | Freq: Two times a day (BID) | INTRAMUSCULAR | Status: DC
Start: 2017-05-07 — End: 2017-05-07

## 2017-05-07 MED ORDER — HYDROCODONE-ACETAMINOPHEN 5-325 MG PO TABS: 1.0000 | ORAL_TABLET | ORAL | Status: DC | PRN

## 2017-05-07 MED ORDER — SODIUM CHLORIDE 0.9 % IV SOLN
INTRAVENOUS | Status: DC
  Administered 2017-05-07: 01:00:00 via INTRAVENOUS

## 2017-05-07 MED ORDER — ONDANSETRON HCL 4 MG/2ML IJ SOLN: 4.0000 mg | Freq: Four times a day (QID) | INTRAMUSCULAR | Status: DC | PRN

## 2017-05-07 MED ORDER — MORPHINE BOLUS VIA INFUSION
5.0000 mg | INTRAVENOUS | Status: DC | PRN
  Filled 2017-05-07: qty 20

## 2017-05-07 MED ORDER — IPRATROPIUM-ALBUTEROL 0.5-2.5 (3) MG/3ML IN SOLN
3.0000 mL | RESPIRATORY_TRACT | Status: DC
  Filled 2017-05-07 (×2): qty 3

## 2017-05-07 MED ORDER — BISACODYL 10 MG RE SUPP: 10.0000 mg | Freq: Every day | RECTAL | Status: DC | PRN

## 2017-05-07 MED ORDER — MORPHINE SULFATE (CONCENTRATE) 10 MG/0.5ML PO SOLN: 10.0000 mg | ORAL | Status: DC | PRN

## 2017-05-07 MED ORDER — INSULIN ASPART 100 UNIT/ML ~~LOC~~ SOLN: 0.0000 [IU] | Freq: Three times a day (TID) | SUBCUTANEOUS | Status: DC

## 2017-05-07 MED ORDER — SODIUM CHLORIDE 0.9 % IV SOLN
5.0000 mg/h | INTRAVENOUS | Status: DC
  Administered 2017-05-07: 10 mg/h via INTRAVENOUS
  Filled 2017-05-07: qty 10

## 2017-05-07 MED ORDER — GLYCOPYRROLATE 0.2 MG/ML IJ SOLN
0.1000 mg | INTRAMUSCULAR | Status: DC | PRN
  Administered 2017-05-07: 0.1 mg via INTRAVENOUS
  Filled 2017-05-07: qty 1

## 2017-05-07 MED ORDER — POLYETHYLENE GLYCOL 3350 17 G PO PACK: 17.0000 g | PACK | Freq: Every day | ORAL | Status: DC | PRN

## 2017-05-07 MED ORDER — ENOXAPARIN SODIUM 30 MG/0.3ML ~~LOC~~ SOLN: 30.0000 mg | SUBCUTANEOUS | Status: DC

## 2017-05-07 MED ORDER — ENOXAPARIN SODIUM 40 MG/0.4ML ~~LOC~~ SOLN: 40.0000 mg | SUBCUTANEOUS | Status: DC

## 2017-05-07 MED ORDER — ONDANSETRON HCL 4 MG PO TABS: 4.0000 mg | ORAL_TABLET | Freq: Four times a day (QID) | ORAL | Status: DC | PRN

## 2017-05-07 MED ORDER — ACETAMINOPHEN 325 MG PO TABS: 650.0000 mg | ORAL_TABLET | Freq: Four times a day (QID) | ORAL | Status: DC | PRN

## 2017-05-07 MED ORDER — ACETAMINOPHEN 650 MG RE SUPP: 650.0000 mg | Freq: Four times a day (QID) | RECTAL | Status: DC | PRN

## 2017-05-08 LAB — URINE CULTURE: Special Requests: NORMAL

## 2017-05-15 ENCOUNTER — Telehealth: Payer: Self-pay

## 2017-05-15 NOTE — Telephone Encounter (Signed)
Recieved Death Certificate from _______[employer] [facility] ___ Delivered/Placed __________in nurse box __

## 2017-05-15 NOTE — Telephone Encounter (Signed)
Death cert placed in DK's folder to be signed. 

## 2017-05-17 NOTE — Telephone Encounter (Signed)
[facility]eral Home informed death cert is ready for pick up.

## 2017-05-31 NOTE — Progress Notes (Signed)
   05/23/2017 0149  Vitals  Pulse Rate 79  Pulse Rate Source Monitor  ECG Heart Rate 78  Cardiac Rhythm NSR  Resp 11  Oxygen Therapy  SpO2 (!) 81 %  O2 Device Bi-PAP  FiO2 (%) 100 %  Pulse Oximetry Type Continuous   Pt unresponsive.  Pt placed back on BIPAP at 100%.  Sats up to 99%.  Luci Bankukov, NP at bedside with family.  Awaiting arrival of son.

## 2017-05-31 NOTE — Death Summary Note (Signed)
DEATH SUMMARY   Patient Details  Name: Rachel Landry MRN: [id number] DOB: 09/07/1920  Admission/Discharge Information   Admit Date:  05/30/2017  Date of Death: Date of Death: 2018/01/06  Time of Death: 949  Length of Stay: 1  Referring Physician: System, Pcp Not In   Reason(s) for Hospitalization   Patient was a 84 y/o AA female, Assisted living resident;  with a PMH remarkable for anemia, coronary artery disease, dementia, hypertension and renal disorder, presented to the ED with complaints of hip pain. History is obtained from ED records and from patient's family. Per family, patient has not been eating or drinking much in the last couple of days.  On Friday they were notified that patient complained of hip pain and was less responsive.  X-rays were ordered but were not done at the group home due to patient's profound pain.  The state that patient has not fallen sustaining any injury recently. Symptoms got worse hence patient was brought to the ED.  At the ED, patient was found to be severely hypercarbic and placed on BiPAP and admitted to the ICU.  Her chest x-ray was unremarkable, her creatinine was 2.03 up from her baseline of 1.35 and her WBC was 12.5 K.  Her CT head was negative as well as Rt hip x-ray.  Patient already had a DNR order in place. Her repeat ABG showed improvement in hypercarbia.  Upon arrival in the ICU, patient was transitioned off BiPAP to regular facemask.  Initially she was sustaining her respiratory rate and SPO2 about 30 minutes she started dropping her SPO2 as well as her respiratory rate.  She was placed back on BiPAP and her son and healthcare power of attorney was notified. Patient's family indicated that they did not want her to suffer anymore and that they would like to proceed with full comfort care.  End of life care orders were placed patient was transitioned from BiPAP to nasal cannula started on a morphine drip. Spiritual care was consulted and currently at  bedside with patient's family. Patient subsequently died [id number]    Diagnoses  Preliminary cause of death:  respiratory failure Secondary Diagnoses (including complications and co-morbidities):  Active Problems:   CO2 retention  Pertinent Labs and Studies  Significant Diagnostic Studies Ct Head Wo Contrast  Result Date: 05/16/2017 CLINICAL DATA:  Altered level of consciousness. EXAM: CT HEAD WITHOUT CONTRAST TECHNIQUE: Contiguous axial images were obtained from the base of the skull through the vertex without intravenous contrast. COMPARISON:  02/14/2013 FINDINGS: Brain: Age related generalized atrophy. No evidence of old or acute focal infarction, mass lesion, hemorrhage, hydrocephalus or extra-axial collection. Ordinary chronic small-vessel changes of the deep white matter and basal ganglia. Vascular: There is atherosclerotic calcification of the major vessels at the base of the brain. Skull: Normal Sinuses/Orbits: Clear/normal Other: None IMPRESSION: No acute or reversible finding. Age related atrophy and chronic small-vessel ischemic changes. Electronically Signed   By: Paulina FusiMark  Shogry M.D.   On: 05/05/2017 17:59   Dg Chest Port 1 View  Result Date: 05/22/2017 CLINICAL DATA:  Altered mental status EXAM: PORTABLE CHEST 1 VIEW COMPARISON:  02/14/2013 FINDINGS: Probable soft tissue artifact over the chest. Mildly low lung volumes. No focal opacity or effusion. Mild cardiomegaly. Enlarged mediastinal silhouette likely augmented by portable technique and lordotic positioning. Aortic atherosclerosis. No pneumothorax. Advanced degenerative changes of the right shoulder. IMPRESSION: 1. Low lung volumes without acute infiltrate 2. Mild cardiomegaly. Enlarged mediastinal silhouette, likely augmented by low lung volume, portable  technique and lordotic positioning. Electronically Signed   By: Jasmine Pang M.D.   On: 2017-05-22 19:09   Dg Hip Unilat W Or Wo Pelvis 2-3 Views Left  Result Date:  05/22/17 CLINICAL DATA:  Hip pain EXAM: DG HIP (WITH OR WITHOUT PELVIS) 2-3V LEFT COMPARISON:  01/16/2009 FINDINGS: Pubic symphysis and rami are intact. No acute displaced fracture or malalignment. Mild degenerative changes of the left hip. IMPRESSION: No acute osseous abnormality. Electronically Signed   By: Jasmine Pang M.D.   On: 05-22-2017 19:13   Dg Hip Unilat With Pelvis 2-3 Views Right  Result Date: May 22, 2017 CLINICAL DATA:  Hip pain EXAM: DG HIP (WITH OR WITHOUT PELVIS) 2-3V RIGHT COMPARISON:  01/16/2009 FINDINGS: SI joint degenerative changes without asymmetric widening. Pubic symphysis is intact. No acute displaced fracture or malalignment. Mild degenerative changes of the right hip. IMPRESSION: No definite acute osseous abnormality. Electronically Signed   By: Jasmine Pang M.D.   On: 05/22/17 19:11    Microbiology No results found for this or any previous visit (from the past 240 hour(s)).  Lab Basic Metabolic Panel: Recent Labs  Lab 2017-05-22 1828  NA 146*  K 5.1  CL 105  CO2 32  GLUCOSE 146*  BUN 54*  CREATININE 2.03*  CALCIUM 8.9   Liver Function Tests: Recent Labs  Lab May 22, 2017 1828  AST 35  ALT 24  ALKPHOS 58  BILITOT 0.9  PROT 7.2  ALBUMIN 2.8*   Recent Labs  Lab 05-22-17 1828  LIPASE 35   No results for input(s): AMMONIA in the last 168 hours. CBC: Recent Labs  Lab 22-May-2017 1828  WBC 12.5*  NEUTROABS 10.4*  HGB 12.6  HCT 40.1  MCV 105.5*  PLT 214   Cardiac Enzymes: Recent Labs  Lab 05-22-17 1828  CKTOTAL 171   Sepsis Labs: Recent Labs  Lab 05/22/17 1719 May 22, 2017 1828  WBC  --  12.5*  LATICACIDVEN 1.3  --     Temple Sporer 05/14/2017, 11:43 AM

## 2017-05-31 NOTE — Progress Notes (Signed)
Patient died [id number], CCMD, CDS, and MD Conforti notified.  Second RN Charli verified no heart sounds, no pulse.  Asystole on monitor.  Family at bedside, RN offered to call chaplain, who has spent time with them this AM.  They declined and seemed at peace.  Post mortem flowsheet completed.  Family waiting on one more member to see patient before she leaves unit.  Son Rachel Landry will notify funeral home.

## 2017-05-31 NOTE — Progress Notes (Signed)
   05/12/2017 0940  Clinical Encounter Type  Visited With Patient and family together  Visit Type Follow-up   Chaplain checking in with patient and family; family expressed that they had no needs at present and will contact chaplain as needed.

## 2017-05-31 NOTE — Progress Notes (Signed)
Chaplain at bedside

## 2017-05-31 NOTE — Progress Notes (Signed)
   05/06/2017 0321  Vitals  Pulse Rate 87  ECG Heart Rate 86  Resp 20  Oxygen Therapy  SpO2 99 %  O2 Device Nasal Cannula  O2 Flow Rate (L/min) 2 L/min   Changed to 2L [place] via Luci Bankukov, NP

## 2017-05-31 NOTE — Progress Notes (Signed)
Placed pt back on Bipap at 100% per NP Tukov.

## 2017-05-31 NOTE — Progress Notes (Signed)
eLink Physician-Brief Progress Note Patient Name: Rachel Landry DOB: 01/31/1921 MRN: [id number]   Date of Service  05/10/2017  HPI/Events of Note  84 yo female presents with ALOC. Head CT Scan is negative. ABG = 7.26/82/79 which is c/w acute on chronic hypercapnic respiratory failure. Now on BiPAP. PCCM asked to assume care in the ICU. VSS.  eICU Interventions  No new orders.      Intervention Category Evaluation Type: New Patient Evaluation  Sommer,Steven Eugene 05/04/2017, 1:17 AM

## 2017-05-31 NOTE — Consult Note (Signed)
PULMONARY / CRITICAL CARE MEDICINE   Name: Rachel Landry MRN: [id number] DOB: [birth year]    ADMISSION DATE:  05-10-2017   CONSULTATION DATE:  05/23/2017  REFERRING MD:  Dr Juliene Pina  REASON: Acute hypoxic/hypercarbic respiratory failure  HISTORY OF PRESENT ILLNESS:   This is a 84 y/o AA female, Assisted living resident;  with a PMH as indicated below who presented to the ED with complaints of hip pain. History is obtained from ED records and from patient's family. Per family, patient has not been eating or drinking much in the last couple of days.  On Friday they were notified that patient complained of hip pain and was less responsive.  X-rays were ordered but were not done at the group home due to patient's profound pain.  The state that patient has not fallen sustaining any injury recently.  Symptoms got worse hence patient was brought to the ED yesterday.  At the ED, patient was found to be severely hypercarbic and placed on BiPAP and admitted to the ICU.  Her chest x-ray was unremarkable, her creatinine was 2.03 up from her baseline of 1.35 and her WBC was 12.5 K.  Her CT head was negative as well as Rt hip x-ray.  Patient already had a DNR order in place. Her repeat ABG showed improvement in hypercarbia.  Upon arrival in the ICU, patient was transitioned off BiPAP to regular facemask.  Initially she was sustaining her respiratory rate and SPO2 about 30 minutes she started dropping her SPO2 as well as her respiratory rate.  She was placed back on BiPAP and her son and healthcare power of attorney was notified.  Patient's family indicated that they did not want her to suffer anymore and that they would like to proceed with full comfort care.  End of life care orders were placed patient was transitioned from BiPAP to nasal cannula started on a morphine drip.  Spiritual care was consulted and currently at bedside with patient's family. Her heart rate is sustaining around 80 bpm, SPO2 in the high 90s  and respiratory rate between 12 and 18.  She appears to be in no distress.  PAST MEDICAL HISTORY :  She  has a past medical history of Anemia, CAD (coronary artery disease), Dementia, Hypertension, and Renal disorder.  PAST SURGICAL HISTORY: She  has no past surgical history on file.  Allergies  Allergen Reactions  . Penicillins     No current facility-administered medications on file prior to encounter.    Current Outpatient Medications on File Prior to Encounter  Medication Sig  . Acetaminophen (ACETAMIN PO) Take 650-1,000 mg by mouth. TAKE 650MG  BY MOUTH EVERY 4 HOURS AS NEEDED FOR FEVERS TAKE 1000MG  BY MOUTH EVERY SIX HOURS AS NEEDED FOR PAIN  . aluminum-magnesium hydroxide-simethicone (MAALOX) 200-200-20 MG/5ML SUSP Take 30 mLs by mouth 4 (four) times daily as needed.  Marland Kitchen atorvastatin (LIPITOR) 10 MG tablet Take 10 mg by mouth every evening.  . brimonidine (ALPHAGAN) 0.2 % ophthalmic solution Place 1 drop into both eyes 2 (two) times daily.  . cloNIDine (CATAPRES - DOSED IN MG/24 HR) 0.2 mg/24hr patch Place 0.2 mg onto the skin once a week.  . diphenhydrAMINE (BENADRYL) 25 MG tablet Take 25 mg by mouth every 6 (six) hours as needed.  . ferrous sulfate 325 (65 FE) MG EC tablet Take 325 mg by mouth 2 (two) times daily.  Marland Kitchen glipiZIDE (GLUCOTROL) 5 MG tablet Take 5 mg by mouth 2 (two) times daily with a meal.  .  guaifenesin (ROBITUSSIN) 100 MG/5ML syrup Take 300 mg by mouth every 6 (six) hours as needed for cough.  Marland Kitchen ipratropium-albuterol (DUONEB) 0.5-2.5 (3) MG/3ML SOLN Take 3 mLs by nebulization 3 (three) times daily as needed.  . loperamide (IMODIUM) 2 MG capsule Take 4 mg by mouth as needed for diarrhea or loose stools. Max 8 doses in 24 hours  . LORazepam (ATIVAN) 0.5 MG tablet Take 0.5 mg by mouth 2 (two) times daily.  . magnesium hydroxide (MILK OF MAGNESIA) 400 MG/5ML suspension Take 30 mLs by mouth daily as needed for mild constipation.  . meloxicam (MOBIC) 7.5 MG tablet Take  7.5 mg by mouth every morning.  . Multiple Vitamins-Minerals (I-VITE PROTECT) TABS Take 1 tablet by mouth daily.  Marland Kitchen OLANZapine zydis (ZYPREXA) 10 MG disintegrating tablet Take 10 mg by mouth daily.  . polyethylene glycol (MIRALAX / GLYCOLAX) packet Take 17 g by mouth daily.  . rivastigmine (EXELON) 9.5 mg/24hr Place 9.5 mg onto the skin daily.  . sitaGLIPtin (JANUVIA) 25 MG tablet Take 25 mg by mouth daily.  Marland Kitchen torsemide (DEMADEX) 20 MG tablet Take 20 mg by mouth daily.  . Travoprost, BAK Free, (TRAVATAN) 0.004 % SOLN ophthalmic solution Place 1 drop into both eyes at bedtime.    FAMILY HISTORY:  Her has no family status information on file.    SOCIAL HISTORY: She    REVIEW OF SYSTEMS:   Unable to obtain as patient is somnolent  SUBJECTIVE:   VITAL SIGNS: BP 115/66 (BP Location: Left Arm)   Pulse 77   Temp 98 F (36.7 C) (Axillary)   Resp 11   Ht 5\' 2"  (1.575 m)   Wt 84.8 kg (186 lb 15.2 oz)   SpO2 99%   BMI 34.19 kg/m   HEMODYNAMICS:    VENTILATOR SETTINGS: FiO2 (%):  [28 %-100 %] 100 %  INTAKE / OUTPUT: I/O last 3 completed shifts: In: 500 [IV Piggyback:500] Out: -   PHYSICAL EXAMINATION: General: No acute distress Neuro: Speech is minimal, unable to follow commands, moans and groans, moves all extremities HEENT: PERRLA, trachea midline Cardiovascular:  RRR, S1-S2, no murmur regurg or gallop Lungs: To auscultation bilaterally Abdomen: Nondistended, normal bowel sounds Musculoskeletal: Pain with flexion and extension of the knees and hips Skin: Warm and dry  LABS:  BMET Recent Labs  Lab 05/29/2017 1828  NA 146*  K 5.1  CL 105  CO2 32  BUN 54*  CREATININE 2.03*  GLUCOSE 146*    Electrolytes Recent Labs  Lab 05/22/2017 1828  CALCIUM 8.9    CBC Recent Labs  Lab 05/11/2017 1828  WBC 12.5*  HGB 12.6  HCT 40.1  PLT 214    Coag's No results for input(s): APTT, INR in the last 168 hours.  Sepsis Markers Recent Labs  Lab 05/11/2017 1719   LATICACIDVEN 1.3    ABG Recent Labs  Lab 05/17/2017 2223  PHART 7.36  PCO2ART 61*  PO2ART 81*    Liver Enzymes Recent Labs  Lab 05/19/2017 1828  AST 35  ALT 24  ALKPHOS 58  BILITOT 0.9  ALBUMIN 2.8*    Cardiac Enzymes No results for input(s): TROPONINI, PROBNP in the last 168 hours.  Glucose Recent Labs  Lab 05-29-2017 0105  GLUCAP 145*    Imaging Ct Head Wo Contrast  Result Date: 05/13/2017 CLINICAL DATA:  Altered level of consciousness. EXAM: CT HEAD WITHOUT CONTRAST TECHNIQUE: Contiguous axial images were obtained from the base of the skull through the vertex without intravenous contrast.  COMPARISON:  02/14/2013 FINDINGS: Brain: Age related generalized atrophy. No evidence of old or acute focal infarction, mass lesion, hemorrhage, hydrocephalus or extra-axial collection. Ordinary chronic small-vessel changes of the deep white matter and basal ganglia. Vascular: There is atherosclerotic calcification of the major vessels at the base of the brain. Skull: Normal Sinuses/Orbits: Clear/normal Other: None IMPRESSION: No acute or reversible finding. Age related atrophy and chronic small-vessel ischemic changes. Electronically Signed   By: Paulina FusiMark  Shogry M.D.   On: 05/20/2017 17:59   Dg Chest Port 1 View  Result Date: 05/25/2017 CLINICAL DATA:  Altered mental status EXAM: PORTABLE CHEST 1 VIEW COMPARISON:  02/14/2013 FINDINGS: Probable soft tissue artifact over the chest. Mildly low lung volumes. No focal opacity or effusion. Mild cardiomegaly. Enlarged mediastinal silhouette likely augmented by portable technique and lordotic positioning. Aortic atherosclerosis. No pneumothorax. Advanced degenerative changes of the right shoulder. IMPRESSION: 1. Low lung volumes without acute infiltrate 2. Mild cardiomegaly. Enlarged mediastinal silhouette, likely augmented by low lung volume, portable technique and lordotic positioning. Electronically Signed   By: Jasmine PangKim  Fujinaga M.D.   On: 05/27/2017  19:09   Dg Hip Unilat W Or Wo Pelvis 2-3 Views Left  Result Date: 05/20/2017 CLINICAL DATA:  Hip pain EXAM: DG HIP (WITH OR WITHOUT PELVIS) 2-3V LEFT COMPARISON:  01/16/2009 FINDINGS: Pubic symphysis and rami are intact. No acute displaced fracture or malalignment. Mild degenerative changes of the left hip. IMPRESSION: No acute osseous abnormality. Electronically Signed   By: Jasmine PangKim  Fujinaga M.D.   On: 05/19/2017 19:13   Dg Hip Unilat With Pelvis 2-3 Views Right  Result Date: 05/16/2017 CLINICAL DATA:  Hip pain EXAM: DG HIP (WITH OR WITHOUT PELVIS) 2-3V RIGHT COMPARISON:  01/16/2009 FINDINGS: SI joint degenerative changes without asymmetric widening. Pubic symphysis is intact. No acute displaced fracture or malalignment. Mild degenerative changes of the right hip. IMPRESSION: No definite acute osseous abnormality. Electronically Signed   By: Jasmine PangKim  Fujinaga M.D.   On: 05/19/2017 19:11   DISCUSSION: 84 year old female with a history of chronic kidney disease, hypertension dementia, CAD and anemia presenting with acute hypercarbic respiratory failure requiring BiPAP.  ASSESSMENT  Acute hypercarbic respiratory failure Generalized debility and failure to thrive-oral intake is null Hip pain History of chronic kidney disease, hypertension, dementia, CAD, anemia PLAN Full comfort care Spiritual care consulted and at bedside Morphine infusion and titrate to comfort DNR/DNI  FAMILY  - Updates: After discussions with the family and son who is the healthcare power of attorney regarding patient's presentation and possible treatment options and prognosis, family opted not to proceed with any aggressive measures that would cause the patient any further pain or discomfort.  They do not want her to have a feeding tube or an NG tube.  They do not want to continue to prolong the inevitable.  At this point the family has opted to proceed with full comfort care.  Magdalene S. [facility] ANP-BC Pulmonary and Critical  Care Medicine [facility] Pager [phone] or [phone]  NB: This document was prepared using Dragon voice recognition software and may include unintentional dictation errors.  Pulmonary/critical care attending  I have personally seen and examined Mrs. Henly, reviewed revise and confirm nurse practitioner's note. I independently performed history physical reviewed laboratory studies and imaging studies and spoke with family. 84 year old female with failure to thrive, brought into the emergency department found to be in hypercapnic respiratory failure, initially started on noninvasive ventilation, creatinine was also elevated, CT scan of head and hip was negative. Discussed  with son who is the health care power of attorney and rest the family members and they wished her care to be comfort only. In the setting of patient's advanced age and multiple medical problems patient has made a DO NOT RESUSCITATE, is on a morphine infusion, family is present and aware and appreciative.  Tora Kindred, D.O.  May 24, 2017, 2:34 AM

## 2017-05-31 NOTE — Progress Notes (Signed)
Spoke with MRI - pt currently not stable enough.  Will reassess.

## 2017-05-31 NOTE — Clinical Social Work Note (Signed)
CSW notified that patient has been made comfort care at this time. Please re-consult CSW if hospice home is needed. York SpanielMonica Anwar Sakata MSW,LCSW [phone]

## 2017-05-31 NOTE — Progress Notes (Signed)
   08-05-17 0310  Vitals  Pulse Rate 82  ECG Heart Rate 81  Resp 13  Oxygen Therapy  SpO2 98 %  O2 Device Nasal Cannula  O2 Flow Rate (L/min) 4 L/min   Pt transitioned from Bi-PAP to 4L [place].  Continue to support family

## 2017-05-31 DEATH — deceased

## 2018-08-29 IMAGING — CT CT HEAD W/O CM
3 series · 15 of 47 positions shown, 18 images · non-contrast
Comparison: 02/14/2013

CLINICAL DATA: Altered level of consciousness.

EXAM:
CT HEAD WITHOUT CONTRAST
TECHNIQUE: Contiguous axial images were obtained from the base of the skull
through the vertex without intravenous contrast.

[Series 2: head wo · axial · 0.47mm/px · z∈[-148,-23]mm · 9 of 30 slices shown, 12 images]
[im 3/30  brain]
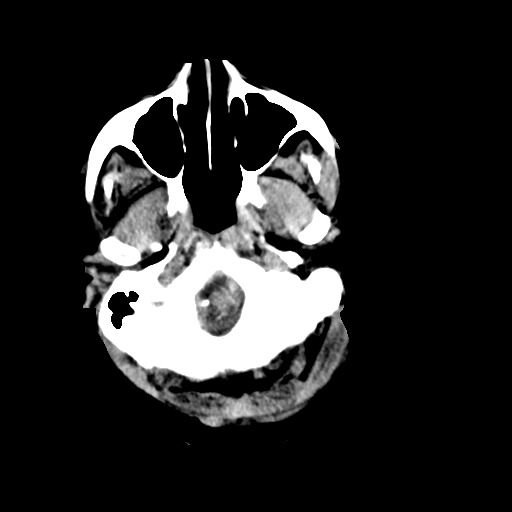
[im 3/30  bone]
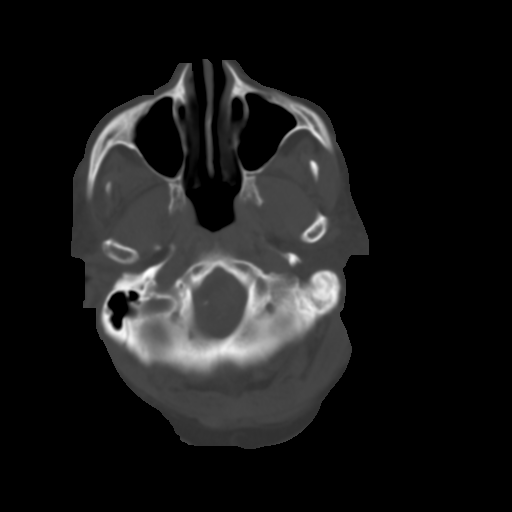
[im 6/30  brain]
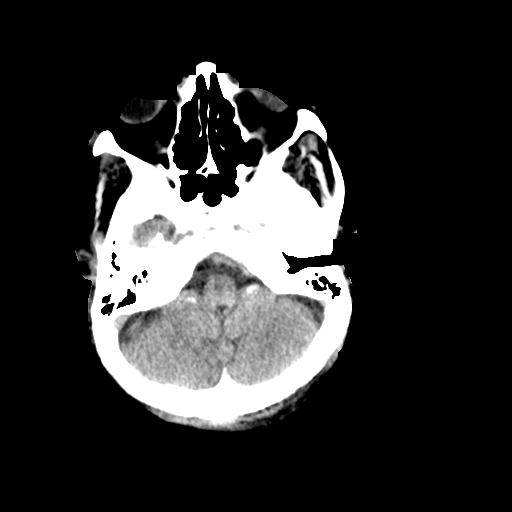
[im 9/30  brain]
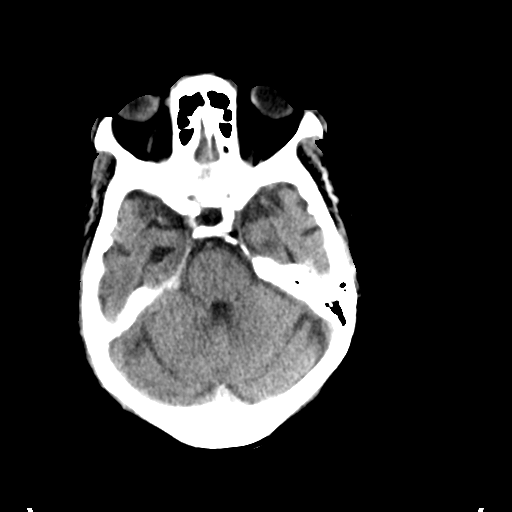
[im 12/30  brain]
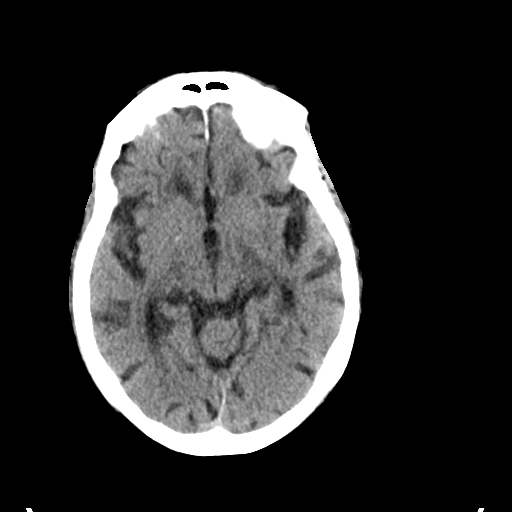
[im 16/30  brain]
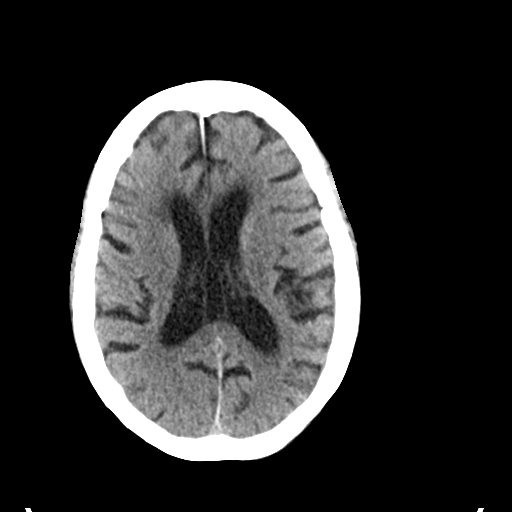
[im 16/30  bone]
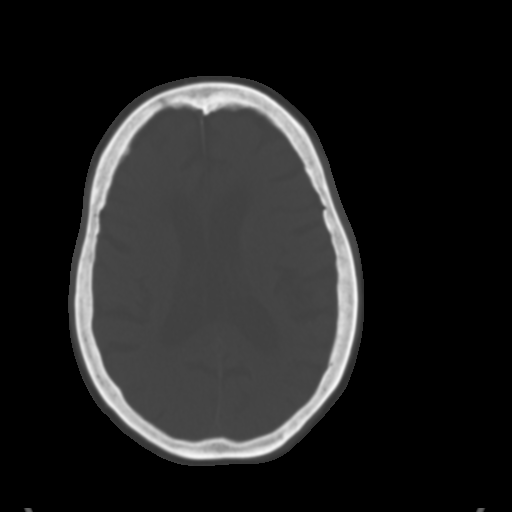
[im 19/30  brain]
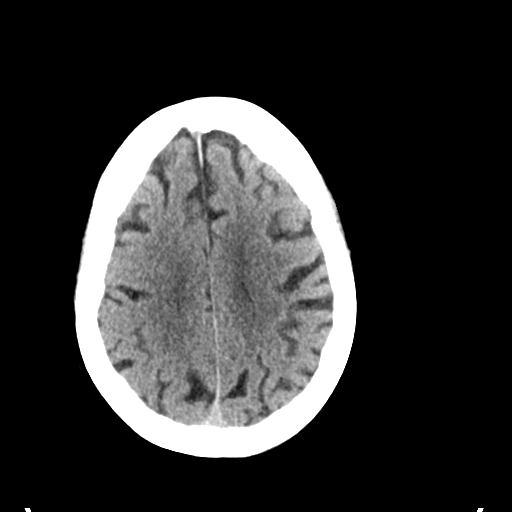
[im 22/30  brain]
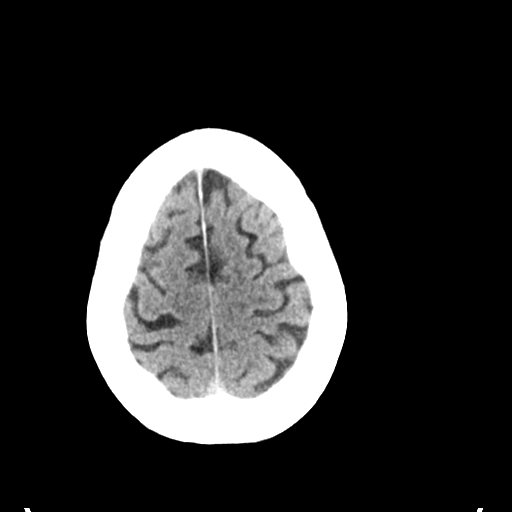
[im 25/30  brain]
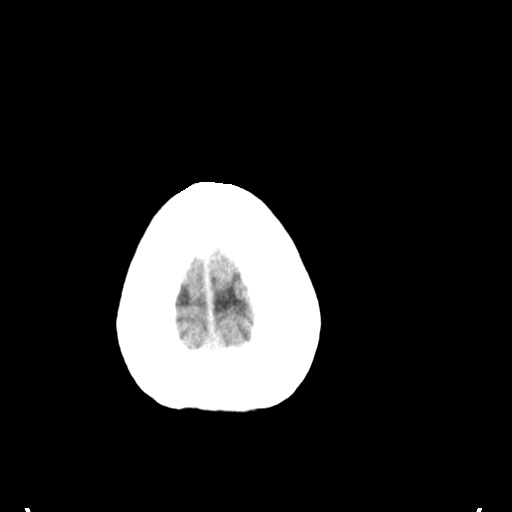
[im 28/30  brain]
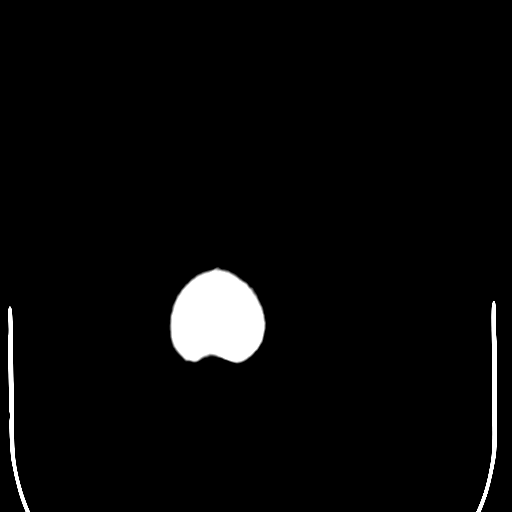
[im 28/30  bone]
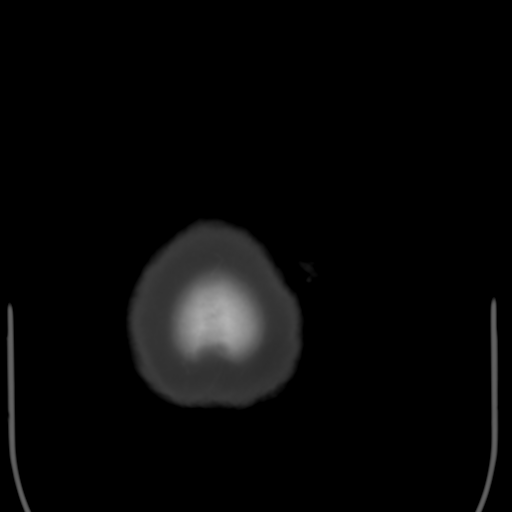

[Series 4: coronal soft tissue · coronal · 0.28mm/px · 3 of 64 slices shown]
[im 22/64  brain]
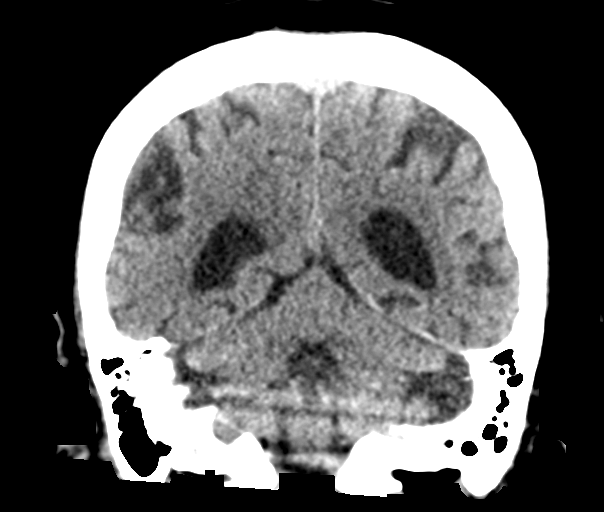
[im 29/64  brain]
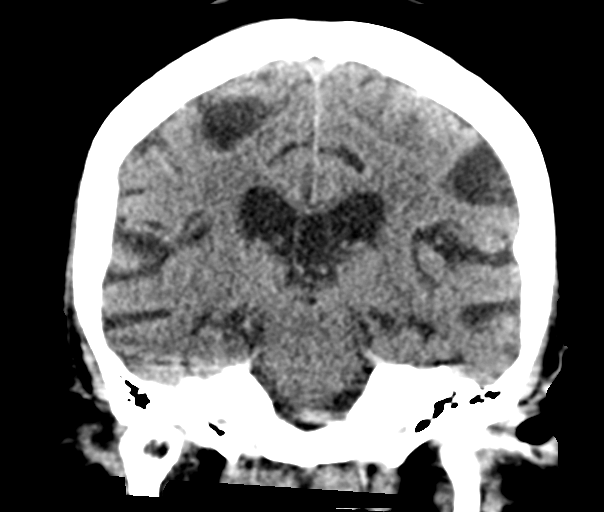
[im 36/64  brain]
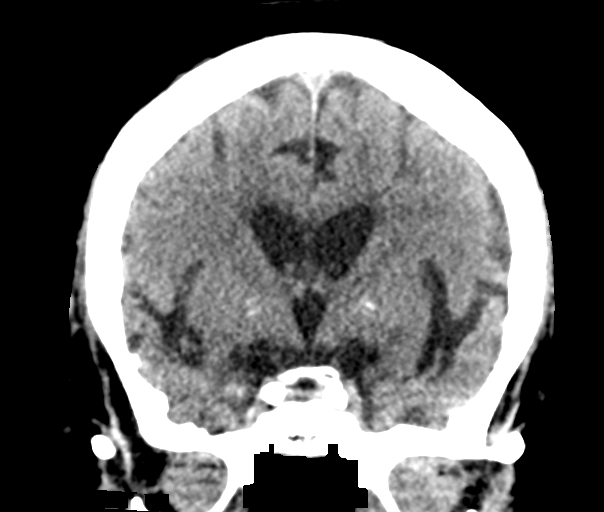

[Series 5: sagittal soft tissue · sagittal · 0.29mm/px · 3 of 52 slices shown]
[im 18/52  brain]
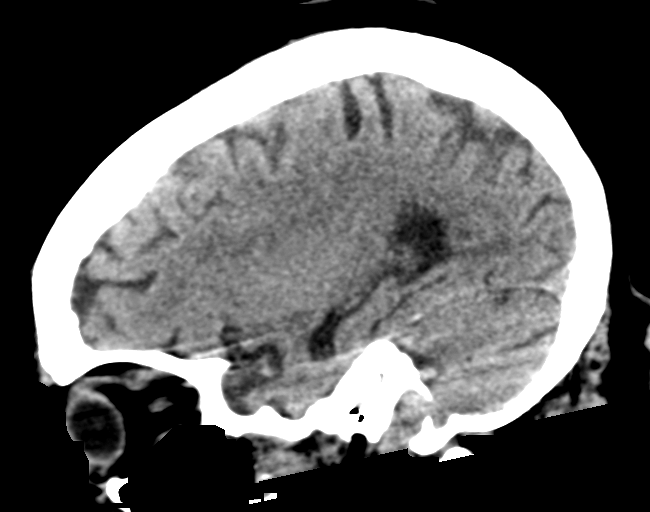
[im 26/52  brain]
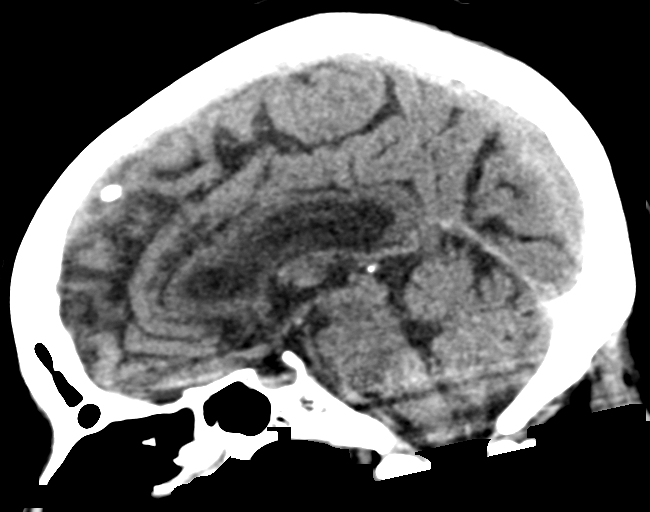
[im 35/52  brain]
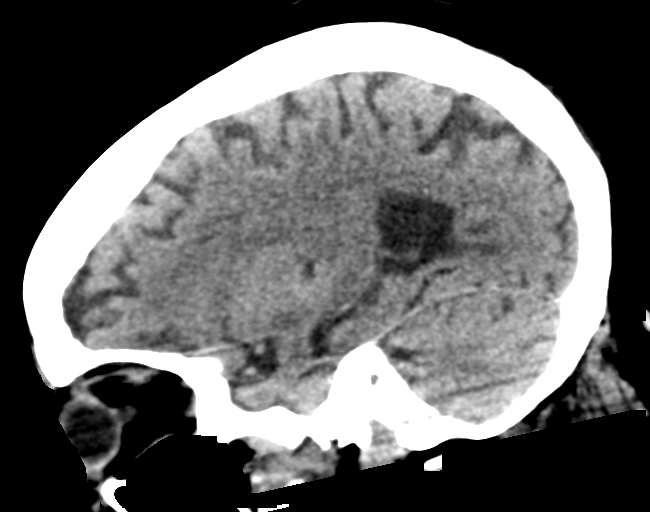

[15 of 47 positions shown; findings below may reference images not displayed]

FINDINGS: Brain: Age related generalized atrophy. No evidence of old or acute
focal infarction, mass lesion, hemorrhage, hydrocephalus or
extra-axial collection. Ordinary chronic small-vessel changes of the
deep white matter and basal ganglia.

Vascular: There is atherosclerotic calcification of the major
vessels at the base of the brain.

Skull: Normal

Sinuses/Orbits: Clear/normal

Other: None
IMPRESSION: No acute or reversible finding. Age related atrophy and chronic
small-vessel ischemic changes.
